# Patient Record
Sex: Female | Born: 1980 | Race: White | Hispanic: No | Marital: Married | Smoking: Never smoker
Health system: Southern US, Community
[De-identification: ages and names within clinical notes are randomized; demographics above are authoritative.]

## PROBLEM LIST (undated history)

## (undated) HISTORY — PX: THYROIDECTOMY: SHX17

---

## 2017-07-05 ENCOUNTER — Emergency Department (INDEPENDENT_AMBULATORY_CARE_PROVIDER_SITE_OTHER): Payer: BLUE CROSS/BLUE SHIELD

## 2017-07-05 ENCOUNTER — Encounter: Payer: Self-pay | Admitting: *Deleted

## 2017-07-05 ENCOUNTER — Other Ambulatory Visit: Payer: Self-pay

## 2017-07-05 ENCOUNTER — Emergency Department
Admission: EM | Admit: 2017-07-05 | Discharge: 2017-07-05 | Disposition: A | Discharge status: Home / Self Care | Payer: BLUE CROSS/BLUE SHIELD | Source: Home / Self Care | Attending: Family Medicine | Admitting: Family Medicine

## 2017-07-05 DIAGNOSIS — M545 Low back pain: Secondary | ICD-10-CM

## 2017-07-05 DIAGNOSIS — M549 Dorsalgia, unspecified: Secondary | ICD-10-CM

## 2017-07-05 DIAGNOSIS — S22000A Wedge compression fracture of unspecified thoracic vertebra, initial encounter for closed fracture: Secondary | ICD-10-CM | POA: Diagnosis not present

## 2017-07-05 DIAGNOSIS — M546 Pain in thoracic spine: Secondary | ICD-10-CM | POA: Diagnosis not present

## 2017-07-05 DIAGNOSIS — W108XXA Fall (on) (from) other stairs and steps, initial encounter: Secondary | ICD-10-CM | POA: Diagnosis not present

## 2017-07-05 DIAGNOSIS — M533 Sacrococcygeal disorders, not elsewhere classified: Secondary | ICD-10-CM

## 2017-07-05 LAB — POCT CBC W AUTO DIFF (K'VILLE URGENT CARE)

## 2017-07-05 LAB — COMPLETE METABOLIC PANEL WITH GFR
AG Ratio: 1.8 (calc) (ref 1.0–2.5)
ALT: 15 U/L (ref 6–29)
AST: 15 U/L (ref 10–30)
Albumin: 4.8 g/dL (ref 3.6–5.1)
Alkaline phosphatase (APISO): 61 U/L (ref 33–115)
BUN: 18 mg/dL (ref 7–25)
CO2: 26 mmol/L (ref 20–32)
Calcium: 9.6 mg/dL (ref 8.6–10.2)
Chloride: 105 mmol/L (ref 98–110)
Creat: 0.71 mg/dL (ref 0.50–1.10)
GFR, Est African American: 126 mL/min/{1.73_m2} (ref >=60)
GFR, Est Non African American: 109 mL/min/{1.73_m2} (ref >=60)
Globulin: 2.6 g/dL (calc) (ref 1.9–3.7)
Glucose, Bld: 96 mg/dL (ref 65–99)
Potassium: 4.8 mmol/L (ref 3.5–5.3)
Sodium: 140 mmol/L (ref 135–146)
Total Bilirubin: 1.2 mg/dL (ref 0.2–1.2)
Total Protein: 7.4 g/dL (ref 6.1–8.1)

## 2017-07-05 MED ORDER — CALCITONIN (SALMON) 200 UNIT/ACT NA SOLN: 1.0000 | Freq: Every day | NASAL | 0 refills | Status: DC

## 2017-07-05 NOTE — Discharge Instructions (Signed)
°  You may take 500mg  acetaminophen (Tylenol) every 4-6 hours or in combination with over the counter Aleve as needed for pain and inflammation.  Please be sure to call to schedule a follow up appointment with Sports Medicine for further evaluation and treatment of your thoracic spine compression fracture.  They may order additional imaging, medication and/or physical therapy.

## 2017-07-05 NOTE — ED Triage Notes (Signed)
Pt reports that she fell on ice on 06/19/17. She fell down steps and landed on her buttocks. She c/o LBP and coccyx pain. She saw a chiropractor 2 days and had an adjustment with no relief. She took Aleve today at 0830.

## 2017-07-05 NOTE — ED Provider Notes (Signed)
[facility]    CSN: [id number] Arrival date & time: 07/05/17  0948     History   Chief Complaint Chief Complaint  Patient presents with  . Back Pain    HPI Rose Rice is a 37 y.o. female.   HPI Rose Rice is a 37 y.o. female presenting to UC with c/o diffuse back pain, worse in lower back and buttock that started on 06/19/17 after falling down 3-4 steps, landing on her buttock.  She has been taking Aleve, last dose around 0830 with minimal relief.  Pain is 7/10, aching and sore, worse with certain movements.  She did see a chiropractor 2 days ago and had an adjustment with minimal relief.  He advised her she may need x-rays of her backas she may have fractured her coccyx.  No prior hx of back surgery or fractures but had been told previously she may have bulging discs in her thoracic spine.  Denies change in bowel or bladder habits. Denies weakness or numbness in arms or legs.  Pt states she does not want narcotic pain medication as she has "had an issue with prescription pain medication in the past."   History reviewed. No pertinent past medical history.  There are no active problems to display for this patient.   Past Surgical History:  Procedure Laterality Date  . CESAREAN SECTION    . THYROIDECTOMY      OB History    No data available       Home Medications    Prior to Admission medications   Medication Sig Start Date End Date Taking? Authorizing Provider  thyroid (ARMOUR) 60 MG tablet Take 60 mg by mouth 2 (two) times daily.   Yes [provider]  calcitonin, salmon, (MIACALCIN) 200 UNIT/ACT nasal spray Place 1 spray into alternate nostrils daily. 07/05/17   Lurene Shadow, PA-C    Family History Family History  Problem Relation Age of Onset  . Thyroid disease Mother     Social History Social History   Tobacco Use  . Smoking status: Never Smoker  . Smokeless tobacco: Never Used  Substance Use Topics  . Alcohol use: No     Frequency: Never  . Drug use: No     Allergies   Sulfa antibiotics   Review of Systems Review of Systems  Genitourinary: Negative for decreased urine volume, dysuria, flank pain, frequency and urgency.  Musculoskeletal: Positive for back pain. Negative for arthralgias, myalgias, neck pain and neck stiffness.  Skin: Negative for color change.  Neurological: Negative for weakness and numbness.     Physical Exam Triage Vital Signs ED Triage Vitals  Enc Vitals Group     BP 07/05/17 1021 117/81     Pulse Rate 07/05/17 1021 60     Resp 07/05/17 1021 16     Temp 07/05/17 1021 97.6 F (36.4 C)     Temp Source 07/05/17 1021 Oral     SpO2 07/05/17 1021 100 %     Weight 07/05/17 1022 135 lb (61.2 kg)     Height 07/05/17 1022 5\' 3"  (1.6 m)     Head Circumference --      Peak Flow --      Pain Score 07/05/17 1022 7     Pain Loc --      Pain Edu? --      Excl. in GC? --    No data found.  Updated Vital Signs BP 117/81 (BP Location: Right Arm)  Pulse 60   Temp 97.6 F (36.4 C) (Oral)   Resp 16   Ht 5\' 3"  (1.6 m)   Wt 135 lb (61.2 kg)   LMP 06/13/2017   SpO2 100%   BMI 23.91 kg/m   Visual Acuity Right Eye Distance:   Left Eye Distance:   Bilateral Distance:    Right Eye Near:   Left Eye Near:    Bilateral Near:     Physical Exam  Constitutional: She is oriented to person, place, and time. She appears well-developed and well-nourished. No distress.  HENT:  Head: Normocephalic and atraumatic.  Eyes: EOM are normal.  Neck: Normal range of motion.  Cardiovascular: Normal rate.  Pulmonary/Chest: Effort normal.  Musculoskeletal: Normal range of motion. She exhibits tenderness. She exhibits no edema.  Tenderness along mid-to lower thoracic spine and lower lumbar spine. No step offs or crepitus.  Slowed movements from lying to sitting and sitting to standing. Limited flexion at the waist. Unable to perform straight leg raise due to pain.  Neurological: She is  alert and oriented to person, place, and time.  Reflex Scores:      Patellar reflexes are 2+ on the right side and 2+ on the left side. Skin: Skin is warm and dry. She is not diaphoretic.  Psychiatric: She has a normal mood and affect. Her behavior is normal.  Nursing note and vitals reviewed.    UC Treatments / Results  Labs (all labs ordered are listed, but only abnormal results are displayed) Labs Reviewed  COMPLETE METABOLIC PANEL WITH GFR  POCT CBC W AUTO DIFF ([facility])    EKG  EKG Interpretation None       Radiology Dg Thoracic Spine W/swimmers  Addendum Date: 07/05/2017   ADDENDUM REPORT: 07/05/2017 11:17 ADDENDUM: Given the history of pain, recent trauma, and mild anterior wedging of a midthoracic vertebral body, cross-sectional imaging, either CT or MRI, could further assess the thoracic spine if there is concern for an acute compression fracture. Electronically Signed   By: Gerome Samavid  Williams III M.D   On: 07/05/2017 11:17   Result Date: 07/05/2017 CLINICAL DATA:  Trauma 3 weeks ago with pain. EXAM: THORACIC SPINE - 3 VIEWS COMPARISON:  None. FINDINGS: Limited views of the chest are normal. Mild anterior wedging of a midthoracic vertebral body with approximately 10% loss of anterior height is identified. This is an age-indeterminate finding. No other fractures. No other cause for pain identified. IMPRESSION: Mild anterior wedging of a midthoracic vertebral body with approximately 10% loss of anterior height is age indeterminate. The thoracic spine is otherwise normal in appearance. Electronically Signed: By: Gerome Samavid  Williams III M.D On: 07/05/2017 11:13   Dg Lumbar Spine Complete  Result Date: 07/05/2017 CLINICAL DATA:  Pain after fall 3 weeks ago EXAM: LUMBAR SPINE - COMPLETE 4+ VIEW COMPARISON:  None. FINDINGS: There is no evidence of lumbar spine fracture. Alignment is normal. Intervertebral disc spaces are maintained. IMPRESSION: Negative. Electronically Signed    By: Gerome Samavid  Williams III M.D   On: 07/05/2017 11:14   Dg Sacrum/coccyx  Result Date: 07/05/2017 CLINICAL DATA:  Pain after fall 3 weeks ago EXAM: SACRUM AND COCCYX - 2+ VIEW COMPARISON:  None. FINDINGS: There is no evidence of fracture or other focal bone lesions. IMPRESSION: Negative. Electronically Signed   By: Gerome Samavid  Williams III M.D   On: 07/05/2017 11:15    Procedures Procedures (including critical care time)  Medications Ordered in UC Medications - No data to display  Initial Impression / Assessment and Plan / UC Course  I have reviewed the triage vital signs and the nursing notes.  Pertinent labs & imaging results that were available during my care of the patient were reviewed by me and considered in my medical decision making (see chart for details).     Hx, exam, and imaging c/w compression fracture of thoracic spine.   No red flag symptoms Pt declined prescription pain medication. She plans on taking acetaminophen and OTC NSAIDs. Discussed case with Dr. Denyse Amass, recommended starting pt on nasal calcitonin. Will get baseline labs as none are seen in medical record. Encouraged to f/u with Sports Medicine next week for further evaluation and continued management of back pain and likely osteopenia/osteoporosis.  (pt notes her mother has osteoporosis and fractured her hip about 1 month ago from a fall).  Final Clinical Impressions(s) / UC Diagnoses   Final diagnoses:  Back pain  Fall down stairs, initial encounter  Thoracic compression fracture, closed, initial encounter [facility])    ED Discharge Orders        Ordered    calcitonin, salmon, (MIACALCIN) 200 UNIT/ACT nasal spray  Daily     07/05/17 1134       Controlled Substance Prescriptions [place] Controlled Substance Registry consulted? Not Applicable   Rolla Plate 07/05/17 1527

## 2017-07-06 ENCOUNTER — Telehealth: Payer: Self-pay | Admitting: Emergency Medicine

## 2017-07-06 MED ORDER — CALCITONIN (SALMON) 200 UNIT/ACT NA SOLN: 1.0000 | Freq: Every day | NASAL | 0 refills | Status: AC

## 2017-07-06 NOTE — Telephone Encounter (Signed)
Called patient, feeling better.  Given lab results. Pt stated pharmacy did not receive script, E. Doroteo GlassmanPhelps, PA-C notified.

## 2017-07-06 NOTE — Telephone Encounter (Signed)
Pt states pharmacy never received prescription for calcitonin that was e-scribed yesterday.  Will try to resend.

## 2019-04-12 IMAGING — DX DG LUMBAR SPINE COMPLETE 4+V
5 series · 5 of 5 positions shown · non-contrast
Comparison: None.

CLINICAL DATA: Pain after fall 3 weeks ago

EXAM:
LUMBAR SPINE - COMPLETE 4+ VIEW

[l-spine ap]
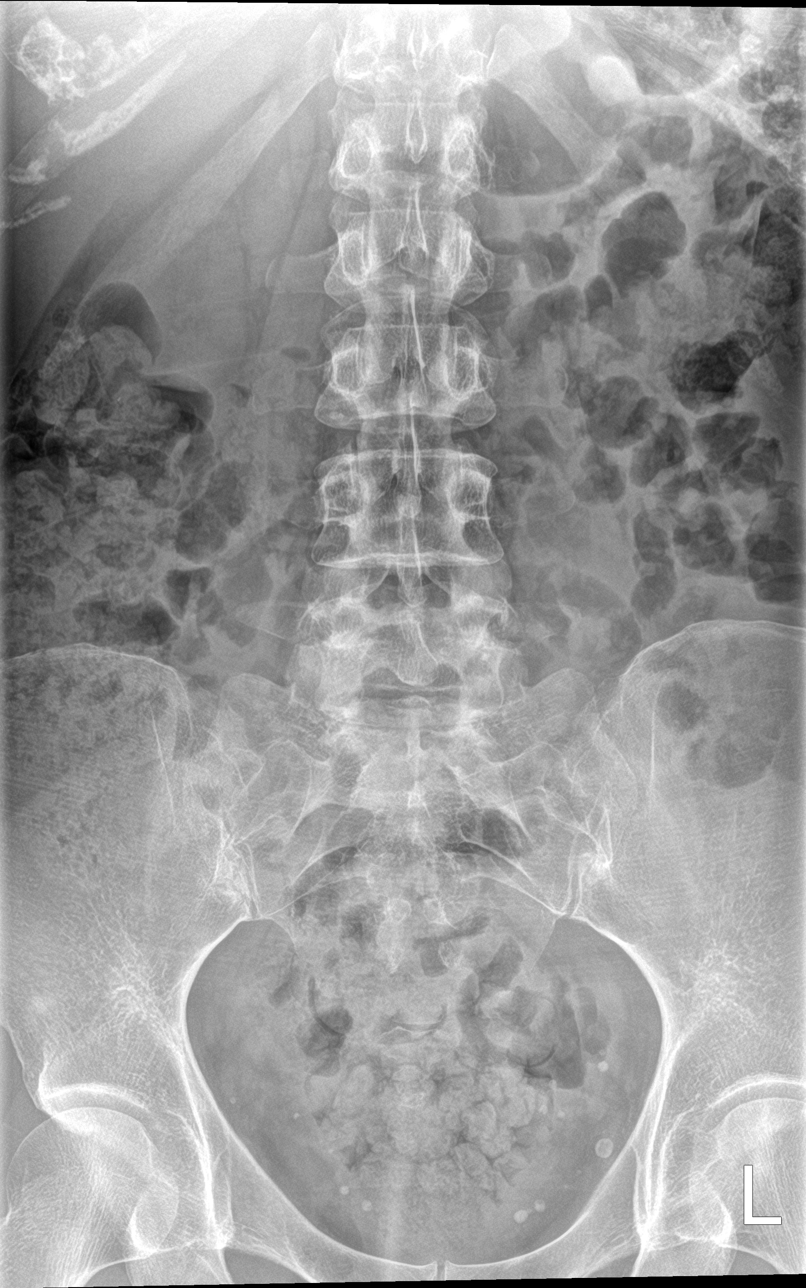

[l-spine obl (1 of 2)]
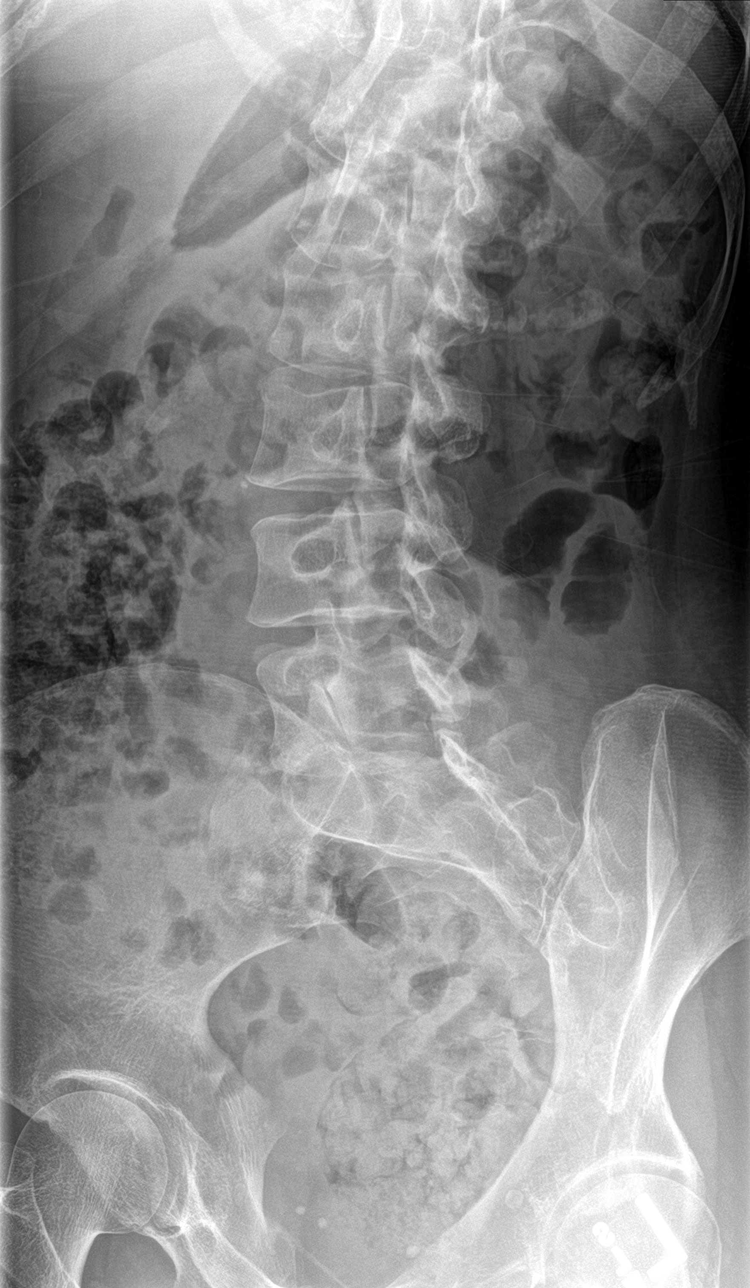

[l-spine obl (2 of 2)]
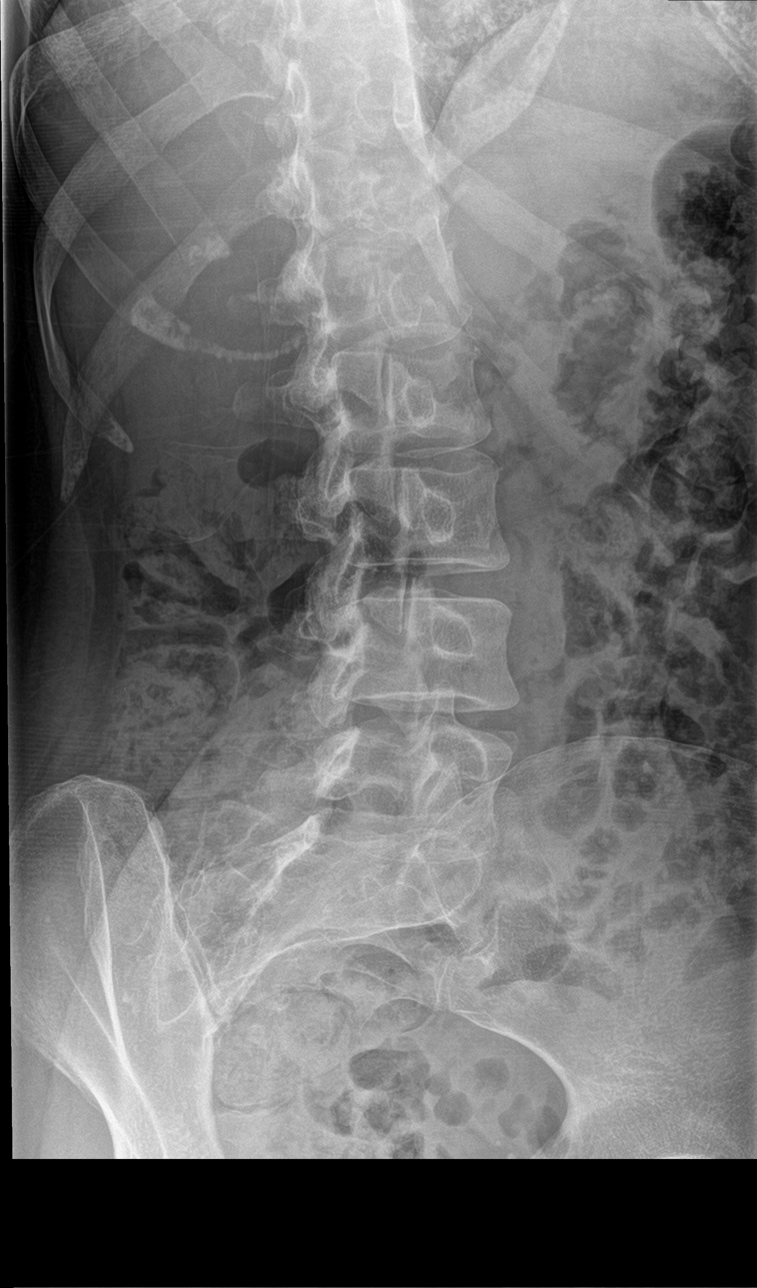

[l-spine lat]
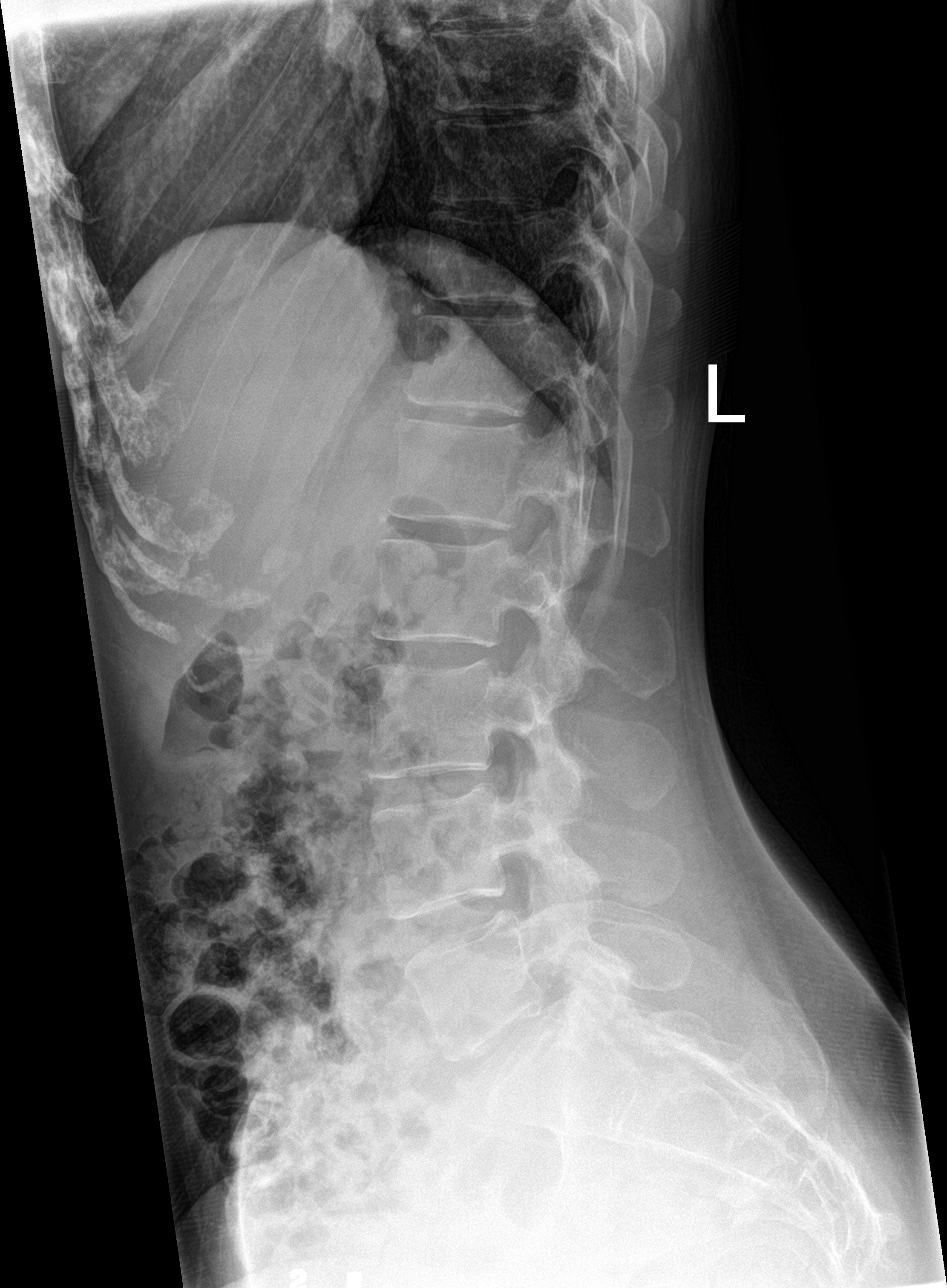

[l-spine spot]
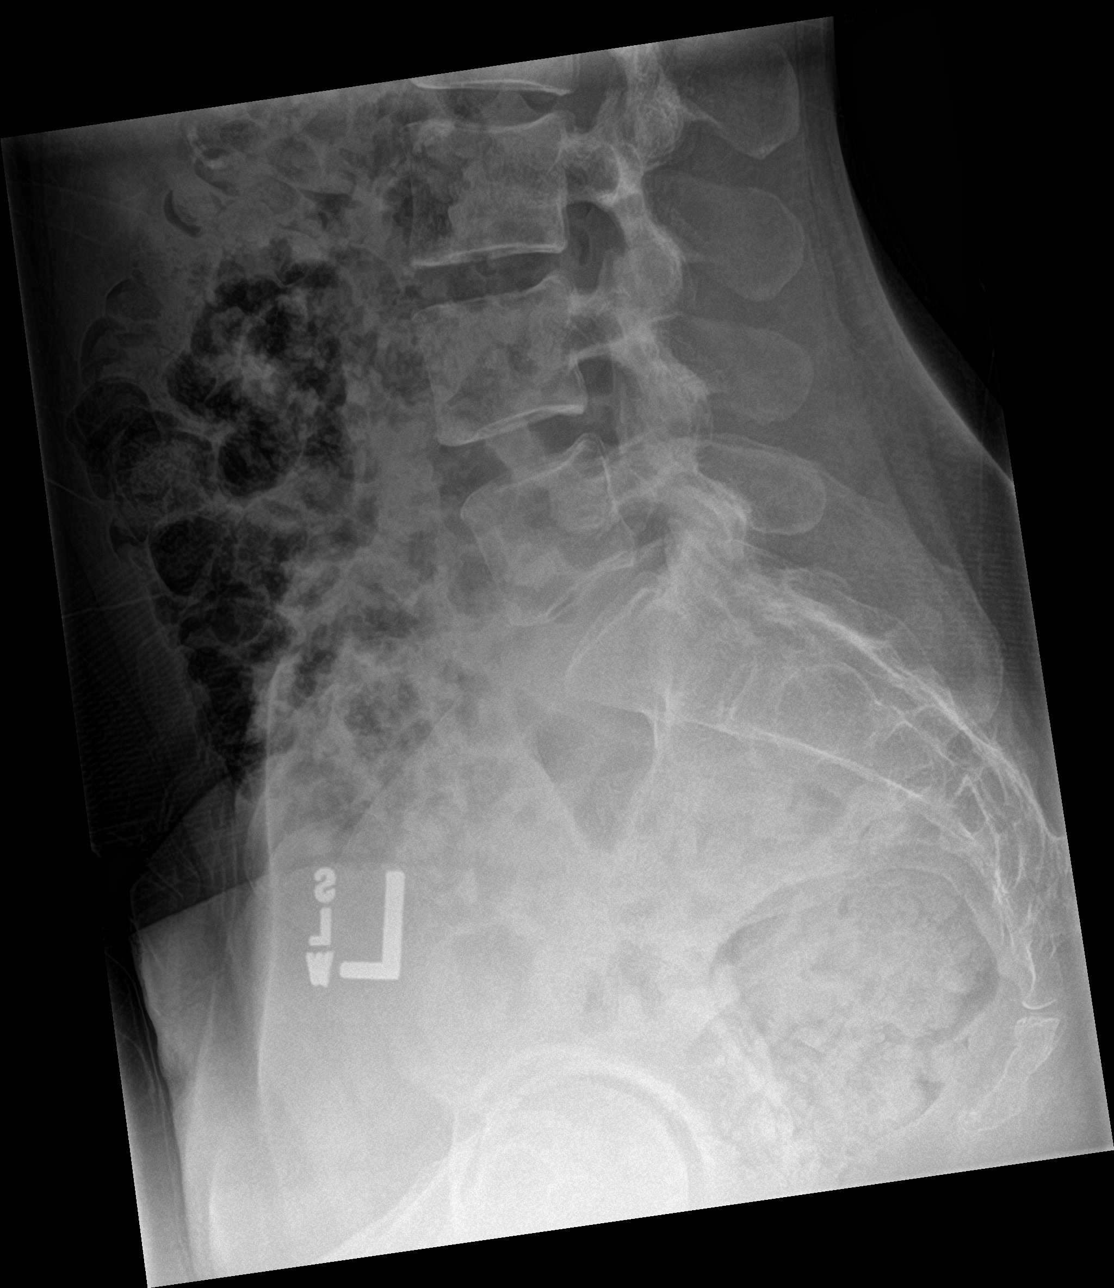

[5 of 5 positions shown; findings below may reference images not displayed]

FINDINGS: There is no evidence of lumbar spine fracture. Alignment is normal.
Intervertebral disc spaces are maintained.
IMPRESSION: Negative.

## 2019-04-12 IMAGING — DX DG SACRUM/COCCYX 2+V
3 series · 3 of 3 positions shown · non-contrast
Comparison: None.

CLINICAL DATA: Pain after fall 3 weeks ago

EXAM:
SACRUM AND COCCYX - 2+ VIEW

[coccyx ap]
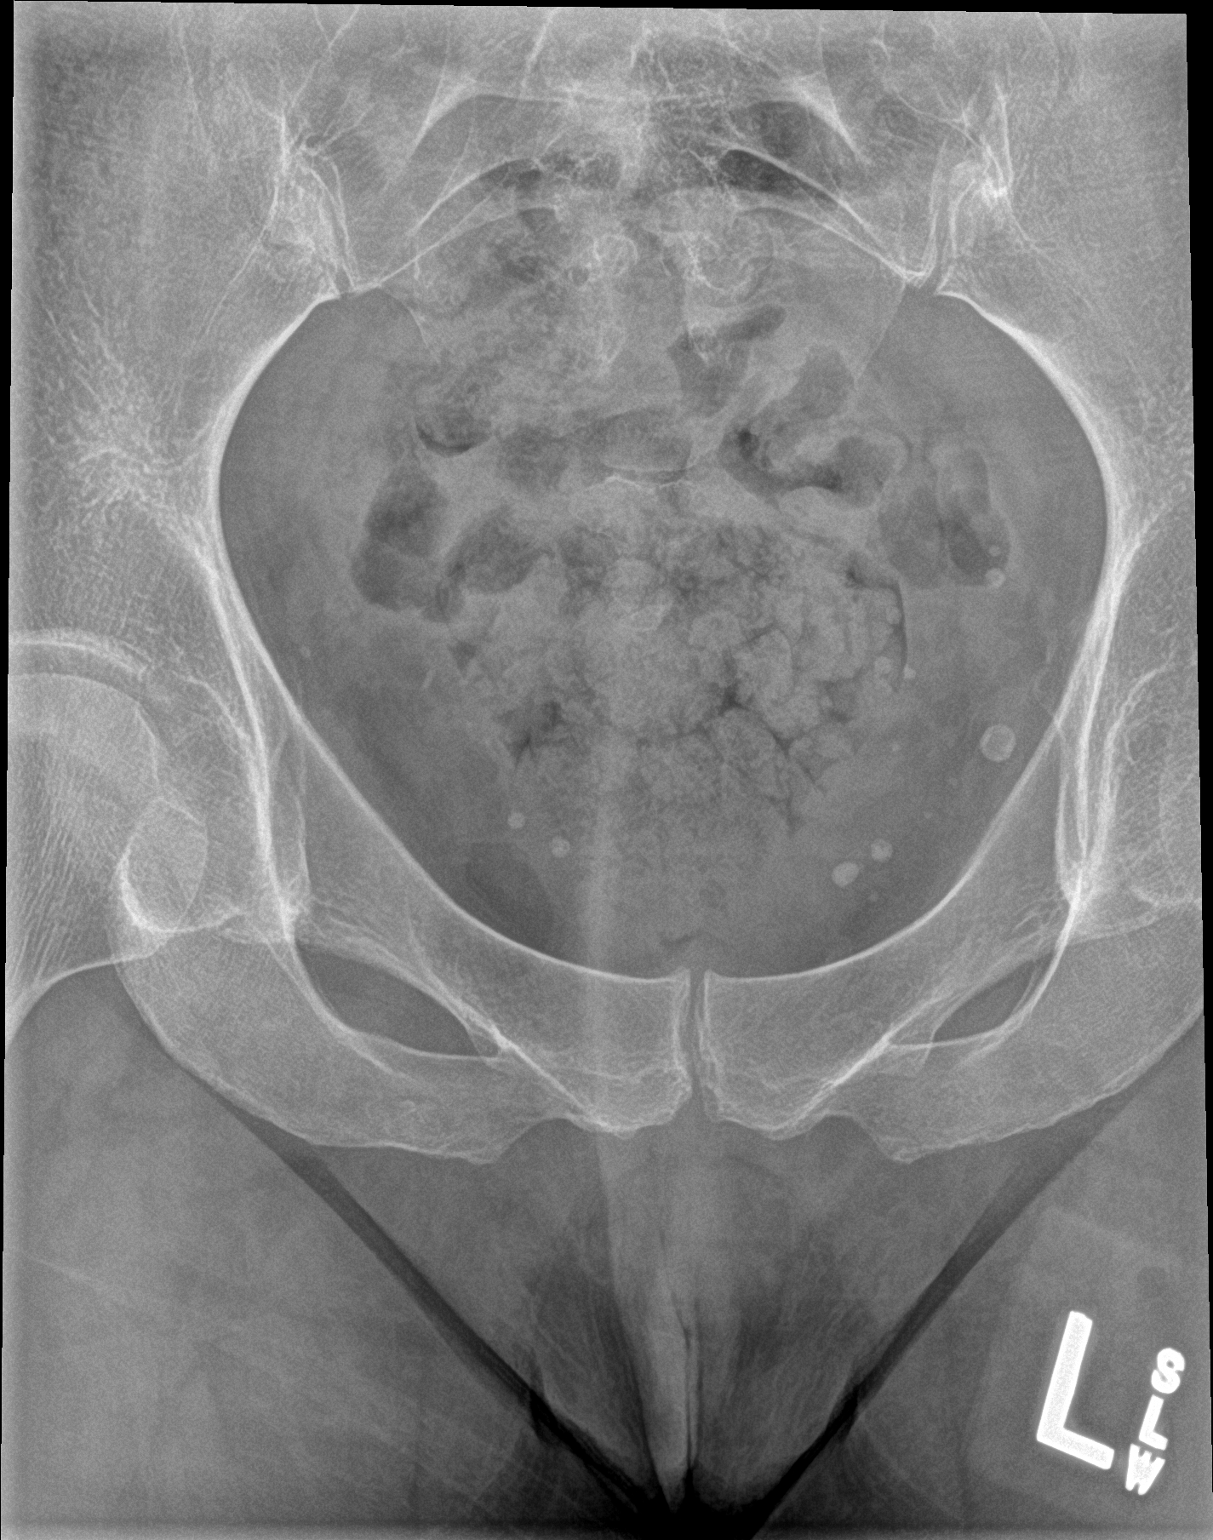

[sacrum ap]
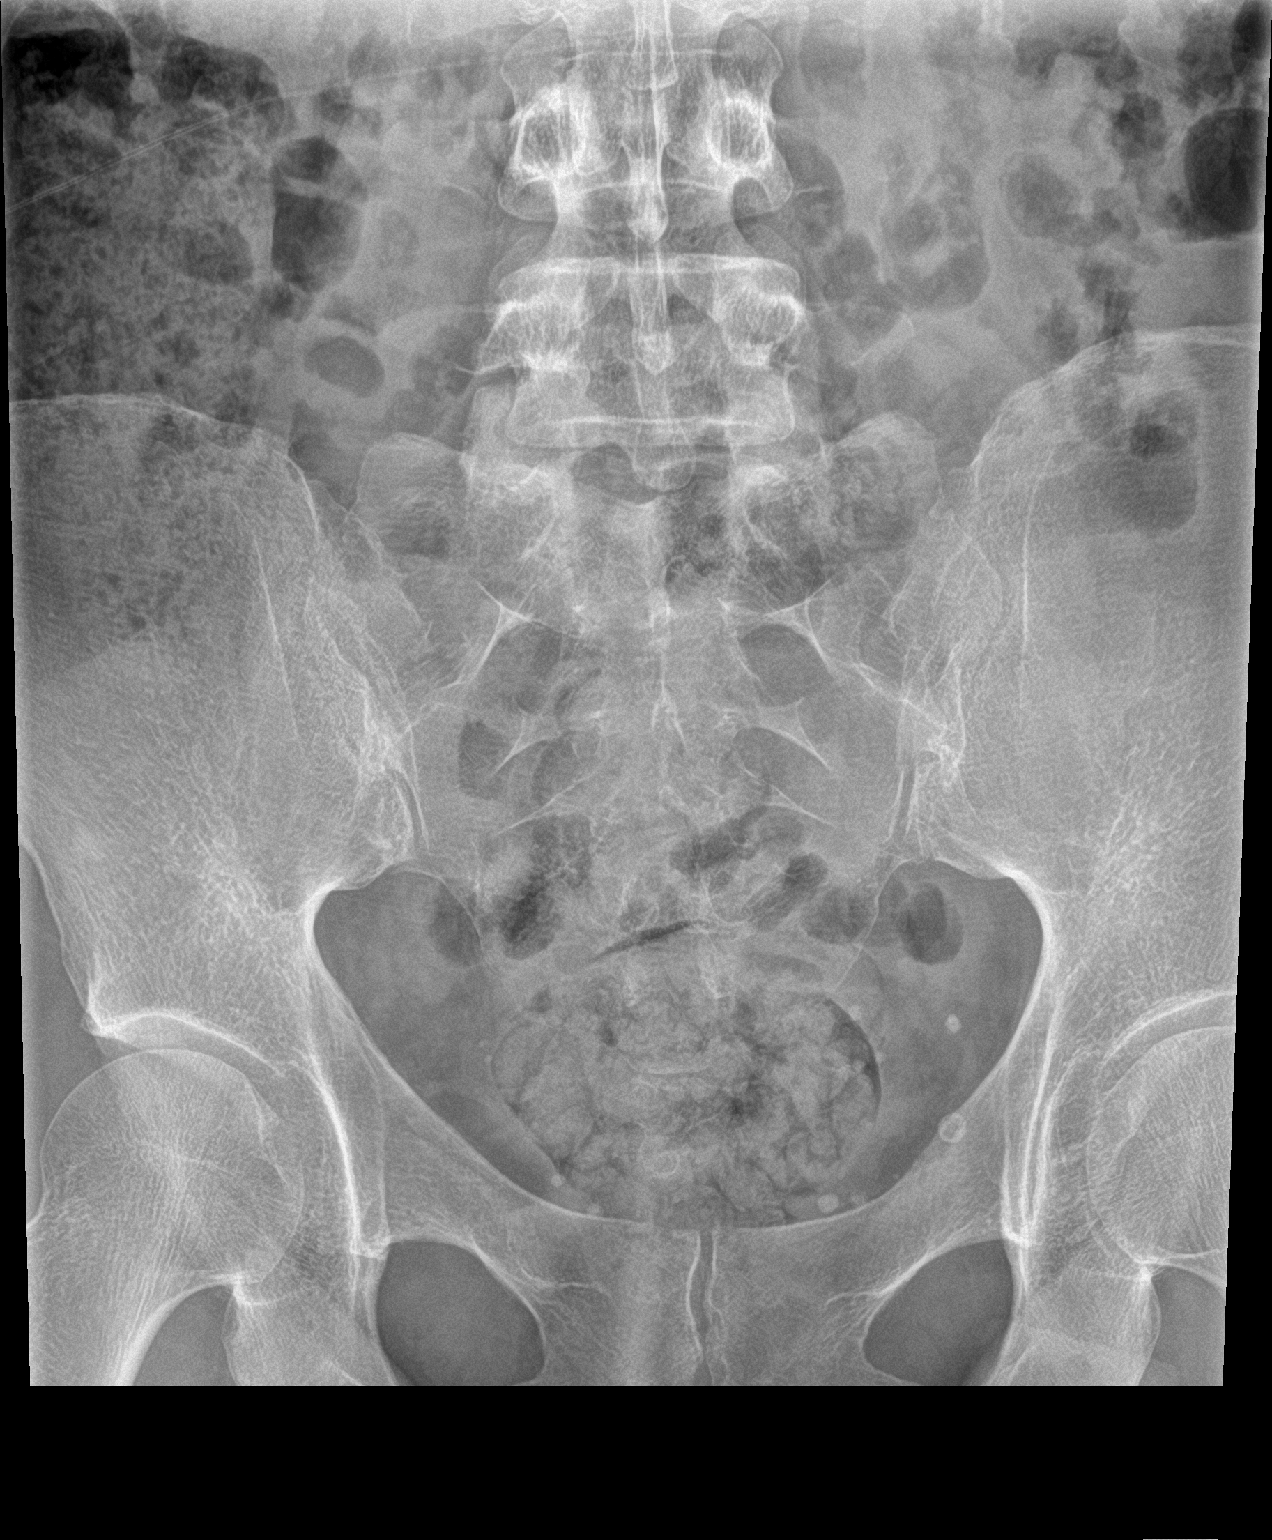

[sacrum lat]
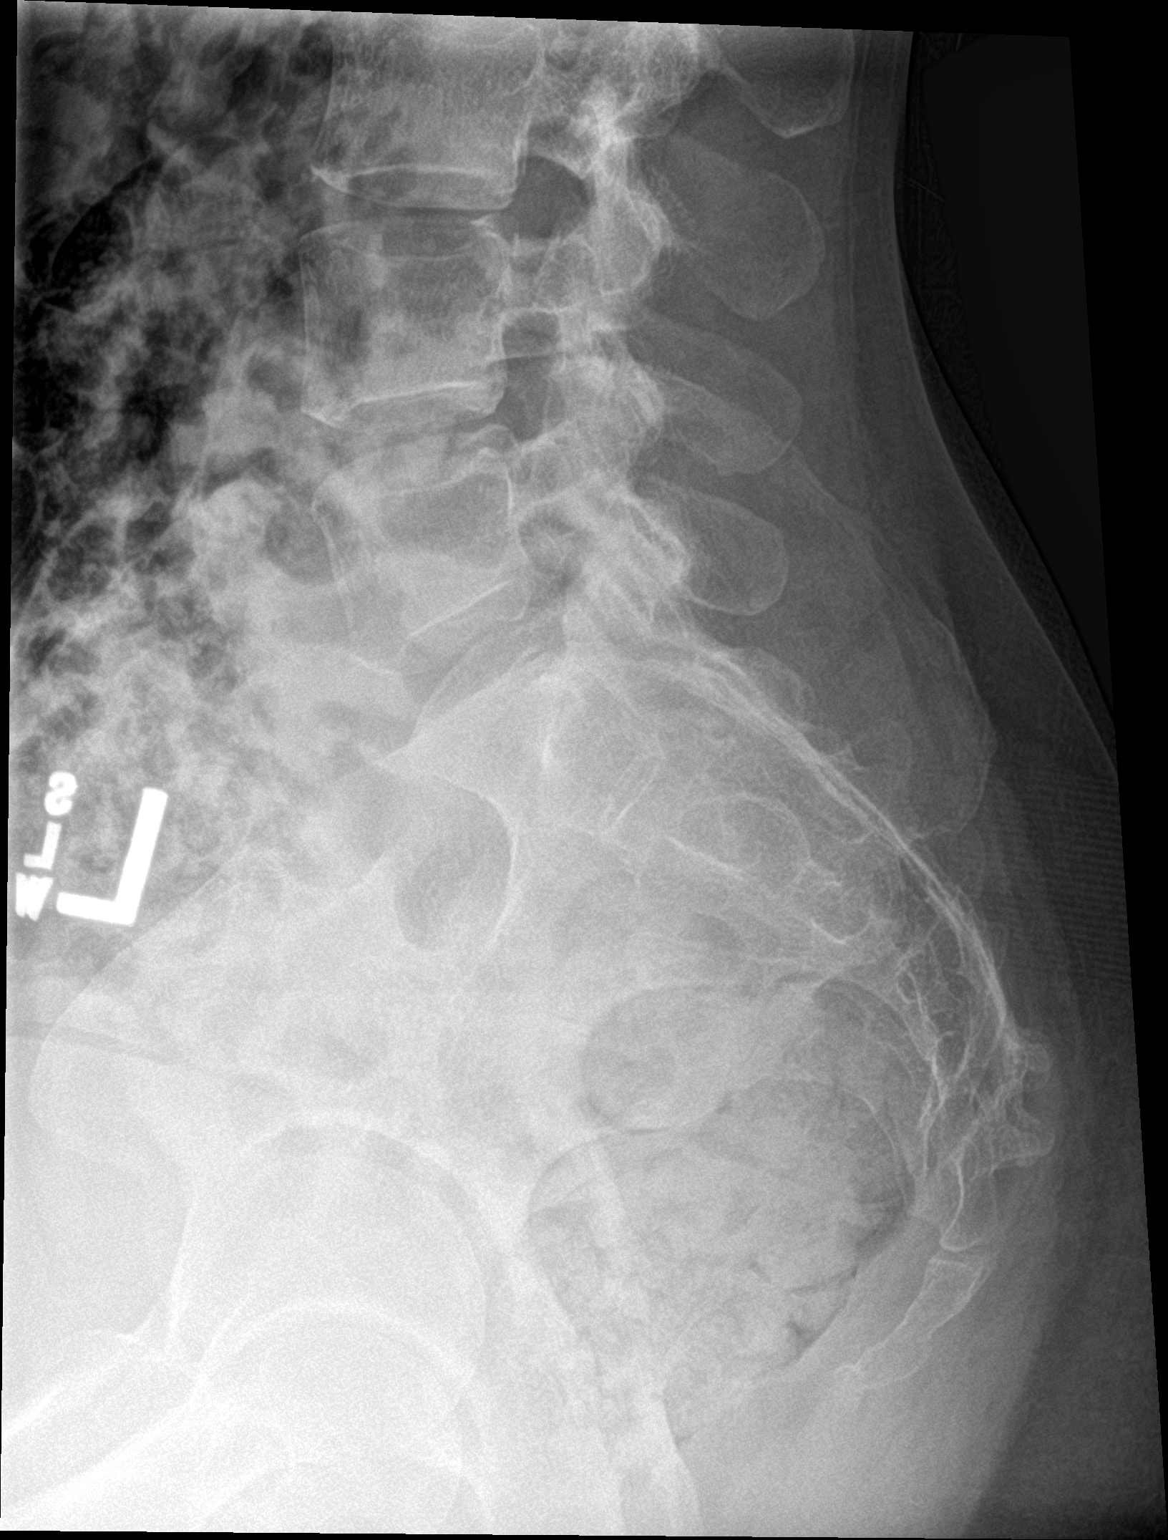

[3 of 3 positions shown; findings below may reference images not displayed]

FINDINGS: There is no evidence of fracture or other focal bone lesions.
IMPRESSION: Negative.
